# Patient Record
Sex: Male | Born: 1950 | Race: White | Hispanic: No | Marital: Married | Smoking: Former smoker
Health system: Southern US, Community
[De-identification: ages and names within clinical notes are randomized; demographics above are authoritative.]

## PROBLEM LIST (undated history)

## (undated) DIAGNOSIS — T7840XA Allergy, unspecified, initial encounter: Secondary | ICD-10-CM

## (undated) DIAGNOSIS — M1990 Unspecified osteoarthritis, unspecified site: Secondary | ICD-10-CM

## (undated) DIAGNOSIS — E119 Type 2 diabetes mellitus without complications: Secondary | ICD-10-CM

## (undated) DIAGNOSIS — E785 Hyperlipidemia, unspecified: Secondary | ICD-10-CM

## (undated) DIAGNOSIS — H269 Unspecified cataract: Secondary | ICD-10-CM

## (undated) DIAGNOSIS — I10 Essential (primary) hypertension: Secondary | ICD-10-CM

## (undated) HISTORY — DX: Unspecified osteoarthritis, unspecified site: M19.90

## (undated) HISTORY — DX: Unspecified cataract: H26.9

## (undated) HISTORY — DX: Allergy, unspecified, initial encounter: T78.40XA

---

## 2004-04-29 ENCOUNTER — Ambulatory Visit (HOSPITAL_COMMUNITY): Admission: RE | Admit: 2004-04-29 | Discharge: 2004-04-29 | Payer: Self-pay | Admitting: *Deleted

## 2004-08-17 ENCOUNTER — Emergency Department (HOSPITAL_COMMUNITY): Admission: EM | Admit: 2004-08-17 | Discharge: 2004-08-17 | Payer: Self-pay | Admitting: Emergency Medicine

## 2004-10-25 ENCOUNTER — Emergency Department (HOSPITAL_COMMUNITY): Admission: EM | Admit: 2004-10-25 | Discharge: 2004-10-25 | Payer: Self-pay | Admitting: Emergency Medicine

## 2005-03-30 ENCOUNTER — Encounter: Admission: RE | Admit: 2005-03-30 | Discharge: 2005-03-30 | Payer: Self-pay | Admitting: Neurosurgery

## 2005-04-13 ENCOUNTER — Encounter: Admission: RE | Admit: 2005-04-13 | Discharge: 2005-04-13 | Payer: Self-pay | Admitting: Neurosurgery

## 2005-05-21 ENCOUNTER — Encounter: Admission: RE | Admit: 2005-05-21 | Discharge: 2005-05-21 | Payer: Self-pay | Admitting: Neurosurgery

## 2005-08-09 ENCOUNTER — Ambulatory Visit (HOSPITAL_COMMUNITY): Admission: RE | Admit: 2005-08-09 | Discharge: 2005-08-09 | Payer: Self-pay | Admitting: *Deleted

## 2012-10-26 ENCOUNTER — Ambulatory Visit: Payer: BC Managed Care – PPO

## 2012-10-26 ENCOUNTER — Ambulatory Visit (INDEPENDENT_AMBULATORY_CARE_PROVIDER_SITE_OTHER): Payer: BC Managed Care – PPO | Admitting: Internal Medicine

## 2012-10-26 VITALS — BP 133/77 | HR 95 | Temp 98.0°F | Resp 16 | Ht 67.0 in | Wt 239.0 lb

## 2012-10-26 DIAGNOSIS — S8002XA Contusion of left knee, initial encounter: Secondary | ICD-10-CM

## 2012-10-26 DIAGNOSIS — M25562 Pain in left knee: Secondary | ICD-10-CM

## 2012-10-26 DIAGNOSIS — S8000XA Contusion of unspecified knee, initial encounter: Secondary | ICD-10-CM

## 2012-10-26 DIAGNOSIS — M25569 Pain in unspecified knee: Secondary | ICD-10-CM

## 2012-10-26 DIAGNOSIS — M25462 Effusion, left knee: Secondary | ICD-10-CM

## 2012-10-26 DIAGNOSIS — M25469 Effusion, unspecified knee: Secondary | ICD-10-CM

## 2012-10-26 NOTE — Progress Notes (Signed)
  Subjective:    Patient ID: Joel Peters, male    DOB: 1951/02/16, 62 y.o.   MRN: [id number]  HPI Pt presents to clinic today with Lt knee swelling and pain. He states he fell on concrete about 3 weeks ago. The knee initially bruised and swelled a little but since then it has gotten worse. Pt's PCP is Amaryllis Dyke and he has set him up to see an ortho at the end of this month. Pt states he is in a lot of pain and would like the fluid drained off his Lt knee. Swelling is anterior to patella, no redness or heat.  Review of Systems htn    Objective:   Physical Exam  Constitutional: He is oriented to person, place, and time. He appears well-developed and well-nourished.  Pulmonary/Chest: Effort normal.  Musculoskeletal: Normal range of motion. He exhibits edema and tenderness.  Neurological: He is alert and oriented to person, place, and time. He exhibits normal muscle tone. Coordination normal.  Skin: No rash noted. No erythema.  Psychiatric: He has a normal mood and affect.     [facility] reading (PRIMARY) by  Dr Perrin Maltese no fx seen, pre patella swelling Aspirate knee 45cc blood removed/Sterile technique      Assessment & Plan:  Contusion knee/Pre patellar swelling. Wear knee pads/alieve prn/see ortho

## 2012-10-26 NOTE — Patient Instructions (Addendum)
Patella Problems (Patellofemoral Syndrome) This syndrome is caused by changes in the undersurface of the kneecap (patella). The changes vary from minor inflammation to major changes such as breakdown of the cartilage on the undersurface of the patella. The major changes can be seen with an arthroscope (a small, pencil-sized telescope). These changes can result from various factors. These factors may arise from abnormal tracking (movement or malalignment) of the patella. Normally the Patella is in its normal groove located between the condyles (grooved end) of the femur (thigh bone). Abnormal movement leads to increased pressure in the patellofemoral joint. This leads to swelling in the cartilage, inflammation and pain. SYMPTOMS  The patient with this syndrome usually has an ache in the knee. It is often aggravated by:  Prolonged sitting.  Squatting.  Climbing stairs.  Running down hill.  Other exercising that stresses the knee. Other findings may include the knee giving way, swelling, and or locking. TREATMENT  The treatment will depend on the cause of the problem. Sometimes the solution is as simple as cutting down on activities. Giving your joint a rest with the use of crutches and braces can also help. This is generally followed by strengthening exercises. RECOVERY Recovery from a patellar problem depends on the type of problem in your knee and on the treatment required. If conservative treatment works the recovery period may be as little as three to four weeks. If more aggressive therapy such as surgery is required, the recovery period may be several months. Your caregiver will discuss this with you. HOME CARE INSTRUCTIONS  Following exercise, use an ice pack for twenty to thirty minutes three to four times per day. Use a towel between your ice pack and the skin.  Reduction of inflammation with anti-inflammatories may be helpful. Only take over-the-counter or prescription medicines for  pain, discomfort, or fever as directed by your caregiver.  Taping the knee or using a neoprene sleeve with a patellar cutout to provide better tracking of the patella may give relief.  Muscle (quadriceps) strengthening exercises are helpful. Follow your caregiver's advice.  Muscle stretching prior to exercise may be helpful.  Soft tissue therapy using ultrasound, and diathermy may be helpful.  If conservative therapy is not effective, surgery may provide relief. During arthroscopy, your caregiver may discover a rough surface beneath your kneecap. If this happens, your caregiver may smooth this out by shaving the surface. SEEK MEDICAL CARE IF: If you have surgery, see your caregiver if:  There is increased bleeding or clear fluid (more than a small spot) from the wound.  You notice redness, swelling, or increasing pain in the wound.  Pus is coming from wound.  You develop an unexplained oral temperature above 102 F (38.9 C) develops, or as your caregiver suggests.  You notice a foul smell coming from the wound or dressing.  You develop increasing pain or stiffness in your knee. SEEK IMMEDIATE MEDICAL CARE IF:   You develop a rash.  You have difficulty breathing.  You have any allergic problems. MAKE SURE YOU:   Understand these instructions.  Will watch your condition.  Will get help right away if you are not doing well or get worse. Document Released: 04/09/2000 Document Revised: 07/05/2011 Document Reviewed: 04/29/2008 [facility] Patient Information 2014 [place], [place]. Prepatellar Bursitis with Rehab  Bursitis is a condition that is characterized by inflammation of a bursa. Kateri Mc exists in many areas of the body. They are fluid filled sacs that lie between a soft tissue (skin, tendon, or  ligament) and a bone, and they reduce friction between the structures as well as the stress placed on the soft tissue. Prepatellar bursitis is inflammation of the bursa that lies between  the skin and the kneecap (patella). This condition often causes pain over the patella. SYMPTOMS   Pain, tenderness, and/or inflammation over the patella.  Pain that worsens with movement of the knee joint.  Decreased range of motion for the knee joint.  A crackling sound (crepitation) when the bursa is moved or touched.  Occasionally, painless swelling of the bursa.  Fever (when infected). CAUSES  the front of the knee. Repetitive and/ or stressful use of the knee. RISK INCREASES WITH:  Activities in which kneeling and/or falling on one's knees is likely (volleyball or football).  Repetitive and stressful training, especially if it involves running on hills.  Improper training techniques, such as a sudden increase in the intensity, frequency or duration of training.  Failure to warm-up properly before activity.  Poor technique.  Artificial turf. PREVENTION   Avoid kneeling or falling on your knees.  Warm up and stretch properly before activity.  Allow for adequate recovery between workouts.  Maintain physical fitness:  Strength, flexibility, and endurance.  Cardiovascular fitness.  Learn and use proper technique. When possible, a have coach correct improper technique.  Wear properly fitted and padded protective equipment (knee pads). PROGNOSIS  If treated properly, then the symptoms of prepatellar bursitis usually resolve within 2 weeks. RELATED COMPLICATIONS   Recurrent symptoms that result in a chronic problem.  Prolonged healing time, if improperly treated or re-injured.  Limited range of motion.  Infection of bursa.  Chronic inflammation or scarring of bursa. TREATMENT  Treatment initially involves the use of ice and medication to help reduce pain and inflammation. The use of strengthening and stretching exercises may help reduce pain with activity, especially those of the quadriceps and hamstring muscles. These exercises may be performed at home or  with referral to a therapist. Your caregiver may recommend kneepads when you return to playing sports, in order to reduce the stress on the prepatellar bursa. If symptoms persist despite treatment, then your caregiver may drain fluid out with a needle (aspirate) the bursa. If symptoms persist for greater than 6 months despite non-surgical (conservative) treatment, then surgery may be recommended to remove the bursa.  MEDICATION  If pain medication is necessary, then nonsteroidal anti-inflammatory medications, such as aspirin and ibuprofen, or other minor pain relievers, such as acetaminophen, are often recommended.  Do not take pain medication for 7 days before surgery.  Prescription pain relievers may be given if deemed necessary by your caregiver. Use only as directed and only as much as you need.  Corticosteroid injections may be given by your caregiver. These injections should be reserved for the most serious cases, because they may only be given a certain number of times. HEAT AND COLD  Cold treatment (icing) relieves pain and reduces inflammation. Cold treatment should be applied for 10 to 15 minutes every 2 to 3 hours for inflammation and pain and immediately after any activity that aggravates your symptoms. Use ice packs or massage the area with a piece of ice (ice massage).  Heat treatment may be used prior to performing the stretching and strengthening activities prescribed by your caregiver, physical therapist, or athletic trainer. Use a heat pack or soak the injury in warm water. SEEK MEDICAL CARE IF:  Treatment seems to offer no benefit, or the condition worsens.  Any medications produce adverse  side effects. EXERCISES RANGE OF MOTION (ROM) AND STRETCHING EXERCISES - Prepatellar Bursitis These exercises may help you when beginning to rehabilitate your injury. Your symptoms may resolve with or without further involvement from your physician, physical therapist or athletic trainer.  While completing these exercises, remember:   Restoring tissue flexibility helps normal motion to return to the joints. This allows healthier, less painful movement and activity.  An effective stretch should be held for at least 30 seconds.  A stretch should never be painful. You should only feel a gentle lengthening or release in the stretched tissue. STRETCH - Hamstrings, Standing  Stand or sit and extend your right / left leg, placing your foot on a chair or foot stool  Keeping a slight arch in your low back and your hips straight forward.  Lead with your chest and lean forward at the waist until you feel a gentle stretch in the back of your right / left knee or thigh. (When done correctly, this exercise requires leaning only a small distance.)  Hold this position for __________ seconds. Repeat __________ times. Complete this stretch __________ times per day. STRETCH - Quadriceps, Prone   Lie on your stomach on a firm surface, such as a bed or padded floor.  Bend your right / left knee and grasp your ankle. If you are unable to reach, your ankle or pant leg, use a belt around your foot to lengthen your reach.  Gently pull your heel toward your buttocks. Your knee should not slide out to the side. You should feel a stretch in the front of your thigh and/or knee.  Hold this position for __________ seconds. Repeat __________ times. Complete this stretch __________ times per day.  STRETCH - Hamstrings/Adductors, V-Sit   Sit on the floor with your legs extended in a large "V," keeping your knees straight.  With your head and chest upright, bend at your waist reaching for your right foot to stretch your left adductors.  You should feel a stretch in your left inner thigh. Hold for __________ seconds.  Return to the upright position to relax your leg muscles.  Continuing to keep your chest upright, bend straight forward at your waist to stretch your hamstrings.  You should feel a  stretch behind both of your thighs and/or knees. Hold for __________ seconds.  Return to the upright position to relax your leg muscles.  Repeat steps 2 through 4. Repeat __________ times. Complete this exercise __________ times per day.  STRENGTHENING EXERCISES - Prepatellar Bursitis  These exercises may help you when beginning to rehabilitate your injury. They may resolve your symptoms with or without further involvement from your physician, physical therapist or athletic trainer. While completing these exercises, remember:  Muscles can gain both the endurance and the strength needed for everyday activities through controlled exercises.  Complete these exercises as instructed by your physician, physical therapist or athletic trainer. Progress the resistance and repetitions only as guided. STRENGTH - Quadriceps, Isometrics  Lie on your back with your right / left leg extended and your opposite knee bent.  Gradually tense the muscles in the front of your right / left thigh. You should see either your kneecap slide up toward your hip or increased dimpling just above the knee. This motion will push the back of the knee down toward the floor/mat/bed on which you are lying.  Hold the muscle as tight as you can without increasing your pain for __________ seconds.  Relax the muscles slowly and completely  in between each repetition. Repeat __________ times. Complete this exercise __________ times per day.  STRENGTH - Quadriceps, Short Arcs   Lie on your back. Place a __________ inch towel roll under your knee so that the knee slightly bends.  Raise only your lower leg by tightening the muscles in the front of your thigh. Do not allow your thigh to rise.  Hold this position for __________ seconds. Repeat __________ times. Complete this exercise __________ times per day.  OPTIONAL ANKLE WEIGHTS: Begin with ____________________, but DO NOT exceed ____________________. Increase in1 lb/0.5 kg  increments.  STRENGTH - Quadriceps, Straight Leg Raises  Quality counts! Watch for signs that the quadriceps muscle is working to insure you are strengthening the correct muscles and not "cheating" by substituting with healthier muscles.  Lay on your back with your right / left leg extended and your opposite knee bent.  Tense the muscles in the front of your right / left thigh. You should see either your kneecap slide up or increased dimpling just above the knee. Your thigh may even quiver.  Tighten these muscles even more and raise your leg 4 to 6 inches off the floor. Hold for __________ seconds.  Keeping these muscles tense, lower your leg.  Relax the muscles slowly and completely in between each repetition. Repeat __________ times. Complete this exercise __________ times per day.  STRENGTH - Quadriceps, Step-Ups   Use a thick book, step or step stool that is __________ inches tall.  Holding a wall or counter for balance only, not support.  Slowly step-up with your right / left foot, keeping your knee in line with your hip and foot. Do not allow your knee to bend so far that you cannot see your toes.  Slowly unlock your knee and lower yourself to the starting position. Your muscles, not gravity, should lower you. Repeat __________ times. Complete this exercise __________ times per day. Document Released: 04/12/2005 Document Revised: 07/05/2011 Document Reviewed: 07/25/2008 [facility] Patient Information 2014 [place], [place].

## 2012-11-22 ENCOUNTER — Other Ambulatory Visit: Payer: Self-pay | Admitting: Internal Medicine

## 2012-11-22 DIAGNOSIS — S4992XA Unspecified injury of left shoulder and upper arm, initial encounter: Secondary | ICD-10-CM

## 2012-11-28 ENCOUNTER — Ambulatory Visit
Admission: RE | Admit: 2012-11-28 | Discharge: 2012-11-28 | Disposition: A | Discharge status: Home / Self Care | Payer: BC Managed Care – PPO | Source: Ambulatory Visit | Attending: Internal Medicine | Admitting: Internal Medicine

## 2012-11-28 DIAGNOSIS — S4992XA Unspecified injury of left shoulder and upper arm, initial encounter: Secondary | ICD-10-CM

## 2016-05-05 ENCOUNTER — Emergency Department (INDEPENDENT_AMBULATORY_CARE_PROVIDER_SITE_OTHER): Payer: BLUE CROSS/BLUE SHIELD

## 2016-05-05 ENCOUNTER — Emergency Department (INDEPENDENT_AMBULATORY_CARE_PROVIDER_SITE_OTHER)
Admission: EM | Admit: 2016-05-05 | Discharge: 2016-05-05 | Disposition: A | Discharge status: Home / Self Care | Payer: BLUE CROSS/BLUE SHIELD | Source: Home / Self Care | Attending: Family Medicine | Admitting: Family Medicine

## 2016-05-05 ENCOUNTER — Encounter: Payer: Self-pay | Admitting: *Deleted

## 2016-05-05 DIAGNOSIS — Z23 Encounter for immunization: Secondary | ICD-10-CM | POA: Diagnosis not present

## 2016-05-05 DIAGNOSIS — L03011 Cellulitis of right finger: Secondary | ICD-10-CM

## 2016-05-05 DIAGNOSIS — M19041 Primary osteoarthritis, right hand: Secondary | ICD-10-CM | POA: Diagnosis not present

## 2016-05-05 HISTORY — DX: Essential (primary) hypertension: I10

## 2016-05-05 HISTORY — DX: Hyperlipidemia, unspecified: E78.5

## 2016-05-05 HISTORY — DX: Type 2 diabetes mellitus without complications: E11.9

## 2016-05-05 MED ORDER — CLINDAMYCIN HCL 300 MG PO CAPS: 300.0000 mg | ORAL_CAPSULE | Freq: Three times a day (TID) | ORAL | 0 refills | Status: AC

## 2016-05-05 MED ORDER — TETANUS-DIPHTH-ACELL PERTUSSIS 5-2.5-18.5 LF-MCG/0.5 IM SUSP
0.5000 mL | Freq: Once | INTRAMUSCULAR | Status: AC
  Administered 2016-05-05: 0.5 mL via INTRAMUSCULAR

## 2016-05-05 NOTE — ED Triage Notes (Signed)
Pt c/o RT 2nd finger redness, swelling and possible splinter of wood x 8 days.

## 2016-05-05 NOTE — ED Provider Notes (Signed)
[facility]    CSN: [id number] Arrival date & time: 05/05/16  1809     History   Chief Complaint Chief Complaint  Patient presents with  . Foreign Body in Skin    HPI Joel Peters is a 66 y.o. male.   Patient removed a wood splinter from the dorsal surface of his right second finger DIP joint about 8 days ago.  He had continued swelling of his finger, and believed that there was a retained piece of splinter present.  He drained the inflamed area yesterday without finding additional foreign body. He does not remember his last Tdap.   The history is provided by the patient and the spouse.  Hand Pain  This is a new problem. The current episode started more than 1 week ago. The problem occurs constantly. The problem has been gradually worsening. Associated symptoms comments: No fever. Exacerbated by: flexing finger. Nothing relieves the symptoms. Treatments tried: draining wound. The treatment provided no relief.    Past Medical History:  Diagnosis Date  . Allergy   . Arthritis   . Cataract   . Diabetes mellitus without complication (HCC)   . Hyperlipidemia   . Hypertension     There are no active problems to display for this patient.   History reviewed. No pertinent surgical history.     Home Medications    Prior to Admission medications   Medication Sig Start Date End Date Taking? Authorizing Provider  celecoxib (CELEBREX) 200 MG capsule Take 200 mg by mouth daily.   Yes Historical Provider, MD  pantoprazole (PROTONIX) 40 MG tablet Take 40 mg by mouth daily.   Yes Historical Provider, MD  simvastatin (ZOCOR) 40 MG tablet Take 40 mg by mouth every evening.   Yes Historical Provider, MD  telmisartan-hydrochlorothiazide (MICARDIS HCT) 80-12.5 MG per tablet Take 1 tablet by mouth daily.   Yes Historical Provider, MD  clindamycin (CLEOCIN) 300 MG capsule Take 1 capsule (300 mg total) by mouth 3 (three) times daily. (every 8 hours) 05/05/16   Lattie HawStephen A Kloee Ballew,  MD  fexofenadine (ALLEGRA) 180 MG tablet Take 180 mg by mouth daily.    Historical Provider, MD    Family History Family History  Problem Relation Age of Onset  . Cancer Mother     leukemia  . Hypertension Father   . Hyperlipidemia Father   . Diabetes Father   . Stroke Father     Social History Social History  Substance Use Topics  . Smoking status: Former Games developermoker  . Smokeless tobacco: Never Used  . Alcohol use No     Allergies   Codeine   Review of Systems Review of Systems  Constitutional: Negative for appetite change, chills, diaphoresis, fatigue and fever.  All other systems reviewed and are negative.    Physical Exam Triage Vital Signs ED Triage Vitals  Enc Vitals Group     BP 05/05/16 1832 152/82     Pulse Rate 05/05/16 1832 87     Resp 05/05/16 1832 18     Temp 05/05/16 1832 98.3 F (36.8 C)     Temp Source 05/05/16 1832 Oral     SpO2 05/05/16 1832 94 %     Weight 05/05/16 1835 237 lb (107.5 kg)     Height --      Head Circumference --      Peak Flow --      Pain Score 05/05/16 1836 8     Pain Loc --  Pain Edu? --      Excl. in GC? --    No data found.   Updated Vital Signs BP 152/82 (BP Location: Left Arm)   Pulse 87   Temp 98.3 F (36.8 C) (Oral)   Resp 18   Wt 237 lb (107.5 kg)   SpO2 94%   BMI 37.12 kg/m   Visual Acuity Right Eye Distance:   Left Eye Distance:   Bilateral Distance:    Right Eye Near:   Left Eye Near:    Bilateral Near:     Physical Exam  Constitutional: He appears well-developed and well-nourished. No distress.  HENT:  Head: Normocephalic.  Nose: Nose normal.  Eyes: Pupils are equal, round, and reactive to light.  Cardiovascular: Normal rate.   Pulmonary/Chest: Effort normal.  Musculoskeletal:       Right hand: He exhibits decreased range of motion, tenderness and swelling. Normal sensation noted.       Hands: The dorsal surface of the right second finger DIP joint is erythematous and swollen, with  evidence of recent drainage.  Indurated but not fluctuant.  Distal neurovascular function is intact.  No evidence of retained foreign body.  Neurological: He is alert.  Skin: Skin is warm and dry.  Nursing note and vitals reviewed.    UC Treatments / Results  Labs (all labs ordered are listed, but only abnormal results are displayed) Labs Reviewed - No data to display  EKG  EKG Interpretation None       Radiology Dg Finger Index Right  Result Date: 05/05/2016 CLINICAL DATA:  A piece of wood lodged in the patient's finger 8 days ago while removing a window. Pain and swelling. Initial encounter. EXAM: RIGHT INDEX FINGER 2+V COMPARISON:  None. FINDINGS: Soft tissues are diffusely swollen. No radiopaque foreign body is identified. No bony destructive change or soft tissue gas. 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 IMPRESSION: Soft tissue swelling. Joel Peters is not radiopaque. No foreign body is seen. Osteoarthritis PIP and DIP joints. Electronically Signed   By: Drusilla Kanner M.D.   On: 05/05/2016 18:56    Procedures Procedures (including critical care time)  Medications Ordered in UC Medications - No data to display   Initial Impression / Assessment and Plan / UC Course  I have reviewed the triage vital signs and the nursing notes.  Pertinent labs & imaging results that were available during my care of the patient were reviewed by me and considered in my medical decision making (see chart for details).  Clinical Course   No evidence retained foreign body by exam. Administered Tdap. Begin Clindamycin 300mg  TID for staph coverage. Continue warm soak two or three times daily.  Apply bandage daily to protect joint. Followup with hand surgeon if not resolved one week.     Final Clinical Impressions(s) / UC Diagnoses   Final diagnoses:  Cellulitis of right index finger    New Prescriptions New Prescriptions   CLINDAMYCIN (CLEOCIN) 300 MG  CAPSULE    Take 1 capsule (300 mg total) by mouth 3 (three) times daily. (every 8 hours)     Lattie Haw, MD 05/18/16 1753

## 2016-05-05 NOTE — Discharge Instructions (Signed)
Continue warm soak two or three times daily.  Apply bandage daily to protect joint.

## 2017-02-07 DIAGNOSIS — L439 Lichen planus, unspecified: Secondary | ICD-10-CM | POA: Diagnosis not present

## 2017-02-07 DIAGNOSIS — Z79899 Other long term (current) drug therapy: Secondary | ICD-10-CM | POA: Diagnosis not present

## 2017-02-07 DIAGNOSIS — B370 Candidal stomatitis: Secondary | ICD-10-CM | POA: Diagnosis not present

## 2017-02-07 DIAGNOSIS — L438 Other lichen planus: Secondary | ICD-10-CM | POA: Diagnosis not present

## 2017-03-01 DIAGNOSIS — E7800 Pure hypercholesterolemia, unspecified: Secondary | ICD-10-CM | POA: Diagnosis not present

## 2017-03-01 DIAGNOSIS — I10 Essential (primary) hypertension: Secondary | ICD-10-CM | POA: Diagnosis not present

## 2017-03-01 DIAGNOSIS — M545 Low back pain: Secondary | ICD-10-CM | POA: Diagnosis not present

## 2017-03-01 DIAGNOSIS — G8929 Other chronic pain: Secondary | ICD-10-CM | POA: Diagnosis not present

## 2017-04-07 DIAGNOSIS — Z0000 Encounter for general adult medical examination without abnormal findings: Secondary | ICD-10-CM | POA: Diagnosis not present

## 2017-04-07 DIAGNOSIS — E291 Testicular hypofunction: Secondary | ICD-10-CM | POA: Diagnosis not present

## 2017-04-07 DIAGNOSIS — Z125 Encounter for screening for malignant neoplasm of prostate: Secondary | ICD-10-CM | POA: Diagnosis not present

## 2017-04-07 DIAGNOSIS — I10 Essential (primary) hypertension: Secondary | ICD-10-CM | POA: Diagnosis not present

## 2017-04-13 DIAGNOSIS — E291 Testicular hypofunction: Secondary | ICD-10-CM | POA: Diagnosis not present

## 2017-04-25 DIAGNOSIS — E7800 Pure hypercholesterolemia, unspecified: Secondary | ICD-10-CM | POA: Diagnosis not present

## 2017-04-25 DIAGNOSIS — Z0001 Encounter for general adult medical examination with abnormal findings: Secondary | ICD-10-CM | POA: Diagnosis not present

## 2017-04-25 DIAGNOSIS — E291 Testicular hypofunction: Secondary | ICD-10-CM | POA: Diagnosis not present

## 2017-04-25 DIAGNOSIS — I10 Essential (primary) hypertension: Secondary | ICD-10-CM | POA: Diagnosis not present

## 2017-05-04 DIAGNOSIS — E291 Testicular hypofunction: Secondary | ICD-10-CM | POA: Diagnosis not present

## 2017-05-25 DIAGNOSIS — E291 Testicular hypofunction: Secondary | ICD-10-CM | POA: Diagnosis not present

## 2017-06-15 DIAGNOSIS — E291 Testicular hypofunction: Secondary | ICD-10-CM | POA: Diagnosis not present

## 2017-06-24 DIAGNOSIS — H2513 Age-related nuclear cataract, bilateral: Secondary | ICD-10-CM | POA: Diagnosis not present

## 2017-06-24 DIAGNOSIS — H524 Presbyopia: Secondary | ICD-10-CM | POA: Diagnosis not present

## 2017-06-24 DIAGNOSIS — H11153 Pinguecula, bilateral: Secondary | ICD-10-CM | POA: Diagnosis not present

## 2017-07-13 DIAGNOSIS — E291 Testicular hypofunction: Secondary | ICD-10-CM | POA: Diagnosis not present

## 2017-07-22 DIAGNOSIS — E291 Testicular hypofunction: Secondary | ICD-10-CM | POA: Diagnosis not present

## 2017-07-22 DIAGNOSIS — Z0000 Encounter for general adult medical examination without abnormal findings: Secondary | ICD-10-CM | POA: Diagnosis not present

## 2017-07-22 DIAGNOSIS — E7800 Pure hypercholesterolemia, unspecified: Secondary | ICD-10-CM | POA: Diagnosis not present

## 2017-07-22 DIAGNOSIS — I10 Essential (primary) hypertension: Secondary | ICD-10-CM | POA: Diagnosis not present

## 2017-07-29 DIAGNOSIS — Z79899 Other long term (current) drug therapy: Secondary | ICD-10-CM | POA: Diagnosis not present

## 2017-07-29 DIAGNOSIS — I10 Essential (primary) hypertension: Secondary | ICD-10-CM | POA: Diagnosis not present

## 2017-07-29 DIAGNOSIS — E559 Vitamin D deficiency, unspecified: Secondary | ICD-10-CM | POA: Diagnosis not present

## 2017-07-29 DIAGNOSIS — R739 Hyperglycemia, unspecified: Secondary | ICD-10-CM | POA: Diagnosis not present

## 2017-07-29 DIAGNOSIS — E291 Testicular hypofunction: Secondary | ICD-10-CM | POA: Diagnosis not present

## 2017-07-29 DIAGNOSIS — E7800 Pure hypercholesterolemia, unspecified: Secondary | ICD-10-CM | POA: Diagnosis not present

## 2017-07-29 DIAGNOSIS — M069 Rheumatoid arthritis, unspecified: Secondary | ICD-10-CM | POA: Diagnosis not present

## 2017-08-08 DIAGNOSIS — L438 Other lichen planus: Secondary | ICD-10-CM | POA: Diagnosis not present

## 2017-08-08 DIAGNOSIS — B370 Candidal stomatitis: Secondary | ICD-10-CM | POA: Diagnosis not present

## 2017-08-08 DIAGNOSIS — Z79899 Other long term (current) drug therapy: Secondary | ICD-10-CM | POA: Diagnosis not present

## 2017-10-09 IMAGING — DX DG FINGER INDEX 2+V*R*
3 series · 3 of 3 positions shown · non-contrast
Comparison: None.

CLINICAL DATA: A piece of Rodman lodged in the patient's finger 8
days ago while removing a window. Pain and swelling. Initial
encounter.

EXAM:
RIGHT INDEX FINGER 2+V

[finger ap]
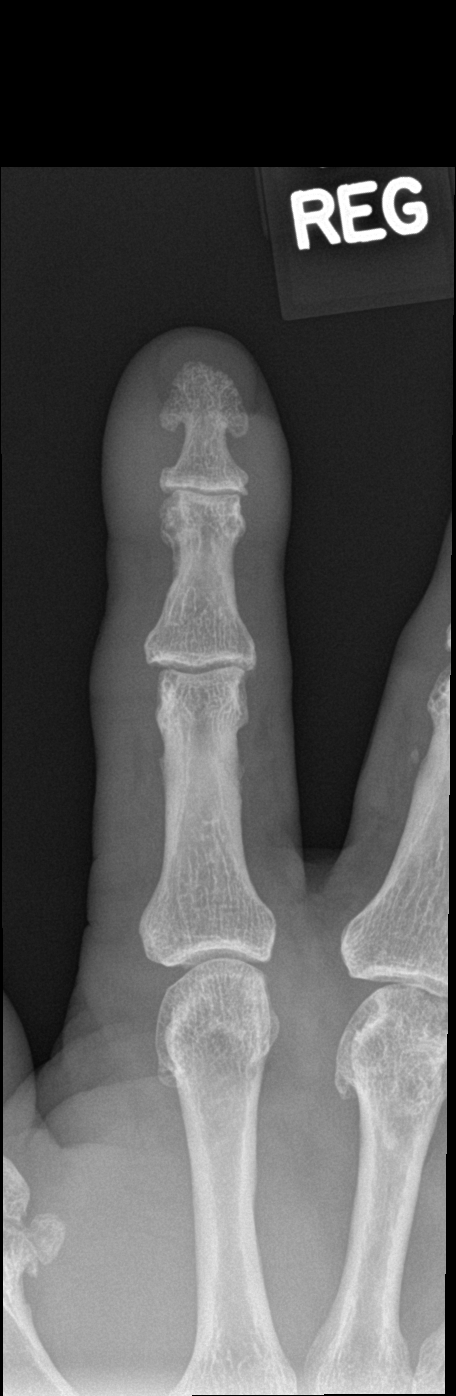

[finger obl]
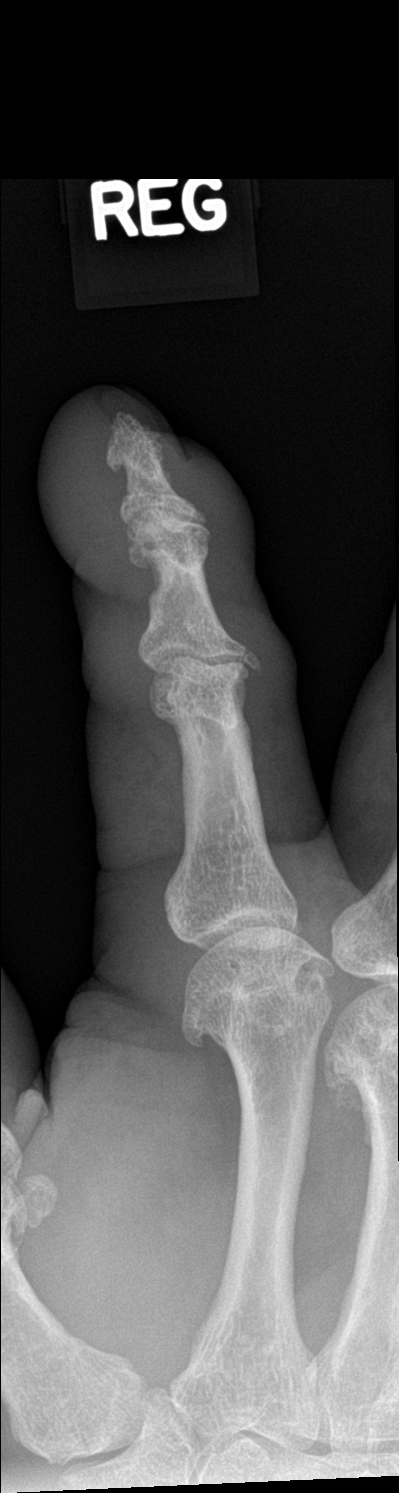

[finger lat]
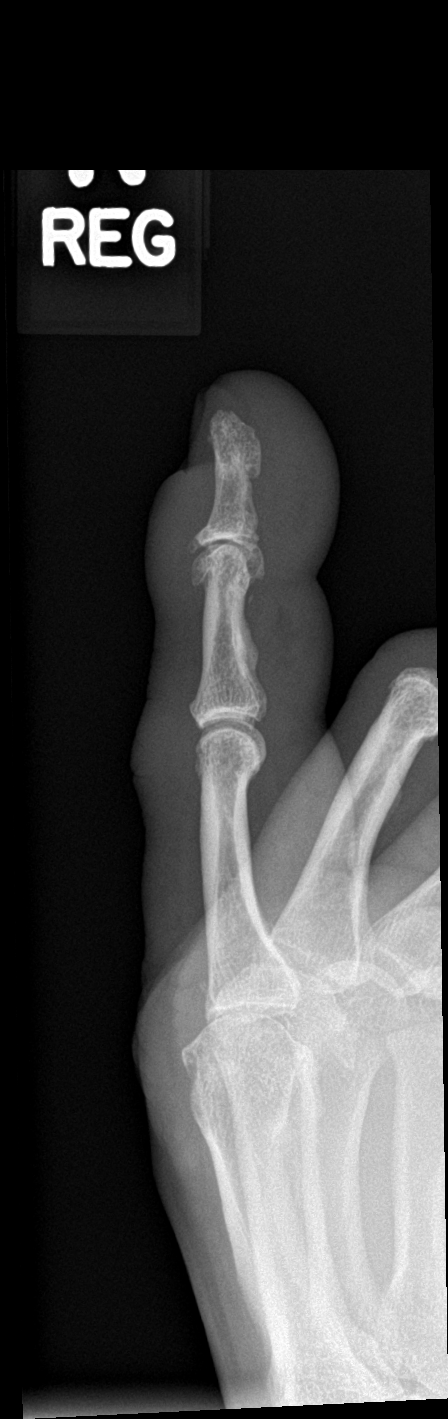

[3 of 3 positions shown; findings below may reference images not displayed]

FINDINGS: Soft tissues are diffusely swollen. No radiopaque foreign body is
identified. No bony destructive change or soft tissue gas.
Osteoarthritis about the DIP and PIP joints is worse at the DIP
joint.
IMPRESSION: Soft tissue swelling. Sharma is not radiopaque. No foreign body is
seen.

Osteoarthritis PIP and DIP joints.

## 2017-10-11 DIAGNOSIS — E291 Testicular hypofunction: Secondary | ICD-10-CM | POA: Diagnosis not present

## 2017-10-31 DIAGNOSIS — I10 Essential (primary) hypertension: Secondary | ICD-10-CM | POA: Diagnosis not present

## 2017-10-31 DIAGNOSIS — R739 Hyperglycemia, unspecified: Secondary | ICD-10-CM | POA: Diagnosis not present

## 2017-10-31 DIAGNOSIS — D649 Anemia, unspecified: Secondary | ICD-10-CM | POA: Diagnosis not present

## 2017-10-31 DIAGNOSIS — R718 Other abnormality of red blood cells: Secondary | ICD-10-CM | POA: Diagnosis not present

## 2017-10-31 DIAGNOSIS — E7800 Pure hypercholesterolemia, unspecified: Secondary | ICD-10-CM | POA: Diagnosis not present

## 2017-11-03 DIAGNOSIS — I10 Essential (primary) hypertension: Secondary | ICD-10-CM | POA: Diagnosis not present

## 2017-11-03 DIAGNOSIS — E291 Testicular hypofunction: Secondary | ICD-10-CM | POA: Diagnosis not present

## 2017-11-22 DIAGNOSIS — H9311 Tinnitus, right ear: Secondary | ICD-10-CM | POA: Diagnosis not present

## 2017-11-22 DIAGNOSIS — H903 Sensorineural hearing loss, bilateral: Secondary | ICD-10-CM | POA: Diagnosis not present

## 2017-11-22 DIAGNOSIS — H8143 Vertigo of central origin, bilateral: Secondary | ICD-10-CM | POA: Diagnosis not present

## 2017-11-22 DIAGNOSIS — J342 Deviated nasal septum: Secondary | ICD-10-CM | POA: Diagnosis not present

## 2017-11-23 DIAGNOSIS — M19041 Primary osteoarthritis, right hand: Secondary | ICD-10-CM | POA: Diagnosis not present

## 2017-11-23 DIAGNOSIS — M12812 Other specific arthropathies, not elsewhere classified, left shoulder: Secondary | ICD-10-CM | POA: Diagnosis not present

## 2017-11-23 DIAGNOSIS — M150 Primary generalized (osteo)arthritis: Secondary | ICD-10-CM | POA: Diagnosis not present

## 2017-11-23 DIAGNOSIS — M19042 Primary osteoarthritis, left hand: Secondary | ICD-10-CM | POA: Diagnosis not present

## 2017-12-15 DIAGNOSIS — R739 Hyperglycemia, unspecified: Secondary | ICD-10-CM | POA: Diagnosis not present

## 2017-12-15 DIAGNOSIS — E7800 Pure hypercholesterolemia, unspecified: Secondary | ICD-10-CM | POA: Diagnosis not present

## 2017-12-15 DIAGNOSIS — E291 Testicular hypofunction: Secondary | ICD-10-CM | POA: Diagnosis not present

## 2017-12-15 DIAGNOSIS — I10 Essential (primary) hypertension: Secondary | ICD-10-CM | POA: Diagnosis not present

## 2018-01-05 DIAGNOSIS — E291 Testicular hypofunction: Secondary | ICD-10-CM | POA: Diagnosis not present

## 2018-01-26 DIAGNOSIS — E291 Testicular hypofunction: Secondary | ICD-10-CM | POA: Diagnosis not present

## 2018-02-16 DIAGNOSIS — E291 Testicular hypofunction: Secondary | ICD-10-CM | POA: Diagnosis not present

## 2018-02-16 DIAGNOSIS — Z23 Encounter for immunization: Secondary | ICD-10-CM | POA: Diagnosis not present

## 2018-03-09 DIAGNOSIS — E291 Testicular hypofunction: Secondary | ICD-10-CM | POA: Diagnosis not present

## 2018-03-30 DIAGNOSIS — E291 Testicular hypofunction: Secondary | ICD-10-CM | POA: Diagnosis not present

## 2018-04-25 DIAGNOSIS — E291 Testicular hypofunction: Secondary | ICD-10-CM | POA: Diagnosis not present

## 2018-05-17 DIAGNOSIS — E291 Testicular hypofunction: Secondary | ICD-10-CM | POA: Diagnosis not present

## 2018-06-07 DIAGNOSIS — E291 Testicular hypofunction: Secondary | ICD-10-CM | POA: Diagnosis not present

## 2018-06-07 DIAGNOSIS — R739 Hyperglycemia, unspecified: Secondary | ICD-10-CM | POA: Diagnosis not present

## 2018-06-07 DIAGNOSIS — E7800 Pure hypercholesterolemia, unspecified: Secondary | ICD-10-CM | POA: Diagnosis not present

## 2018-06-07 DIAGNOSIS — I10 Essential (primary) hypertension: Secondary | ICD-10-CM | POA: Diagnosis not present

## 2018-06-14 DIAGNOSIS — I10 Essential (primary) hypertension: Secondary | ICD-10-CM | POA: Diagnosis not present

## 2018-06-14 DIAGNOSIS — K219 Gastro-esophageal reflux disease without esophagitis: Secondary | ICD-10-CM | POA: Diagnosis not present

## 2018-06-14 DIAGNOSIS — E7800 Pure hypercholesterolemia, unspecified: Secondary | ICD-10-CM | POA: Diagnosis not present

## 2018-06-14 DIAGNOSIS — E291 Testicular hypofunction: Secondary | ICD-10-CM | POA: Diagnosis not present

## 2018-06-20 DIAGNOSIS — M19042 Primary osteoarthritis, left hand: Secondary | ICD-10-CM | POA: Diagnosis not present

## 2018-06-20 DIAGNOSIS — M12812 Other specific arthropathies, not elsewhere classified, left shoulder: Secondary | ICD-10-CM | POA: Diagnosis not present

## 2018-06-20 DIAGNOSIS — M19041 Primary osteoarthritis, right hand: Secondary | ICD-10-CM | POA: Diagnosis not present

## 2018-06-20 DIAGNOSIS — M150 Primary generalized (osteo)arthritis: Secondary | ICD-10-CM | POA: Diagnosis not present

## 2018-06-28 DIAGNOSIS — E291 Testicular hypofunction: Secondary | ICD-10-CM | POA: Diagnosis not present

## 2018-07-19 DIAGNOSIS — E291 Testicular hypofunction: Secondary | ICD-10-CM | POA: Diagnosis not present

## 2018-08-09 DIAGNOSIS — Z23 Encounter for immunization: Secondary | ICD-10-CM | POA: Diagnosis not present

## 2018-08-30 DIAGNOSIS — E291 Testicular hypofunction: Secondary | ICD-10-CM | POA: Diagnosis not present

## 2018-09-25 DIAGNOSIS — E291 Testicular hypofunction: Secondary | ICD-10-CM | POA: Diagnosis not present

## 2018-10-16 DIAGNOSIS — E291 Testicular hypofunction: Secondary | ICD-10-CM | POA: Diagnosis not present

## 2018-11-06 DIAGNOSIS — E291 Testicular hypofunction: Secondary | ICD-10-CM | POA: Diagnosis not present

## 2018-11-14 DIAGNOSIS — M19041 Primary osteoarthritis, right hand: Secondary | ICD-10-CM | POA: Diagnosis not present

## 2018-11-14 DIAGNOSIS — M150 Primary generalized (osteo)arthritis: Secondary | ICD-10-CM | POA: Diagnosis not present

## 2018-11-14 DIAGNOSIS — M19042 Primary osteoarthritis, left hand: Secondary | ICD-10-CM | POA: Diagnosis not present

## 2018-11-14 DIAGNOSIS — M12812 Other specific arthropathies, not elsewhere classified, left shoulder: Secondary | ICD-10-CM | POA: Diagnosis not present

## 2018-12-06 DIAGNOSIS — Z125 Encounter for screening for malignant neoplasm of prostate: Secondary | ICD-10-CM | POA: Diagnosis not present

## 2018-12-06 DIAGNOSIS — E291 Testicular hypofunction: Secondary | ICD-10-CM | POA: Diagnosis not present

## 2018-12-06 DIAGNOSIS — E7800 Pure hypercholesterolemia, unspecified: Secondary | ICD-10-CM | POA: Diagnosis not present

## 2018-12-06 DIAGNOSIS — I10 Essential (primary) hypertension: Secondary | ICD-10-CM | POA: Diagnosis not present

## 2018-12-06 DIAGNOSIS — R7309 Other abnormal glucose: Secondary | ICD-10-CM | POA: Diagnosis not present

## 2018-12-13 DIAGNOSIS — Z0000 Encounter for general adult medical examination without abnormal findings: Secondary | ICD-10-CM | POA: Diagnosis not present

## 2018-12-13 DIAGNOSIS — M069 Rheumatoid arthritis, unspecified: Secondary | ICD-10-CM | POA: Diagnosis not present

## 2018-12-13 DIAGNOSIS — I10 Essential (primary) hypertension: Secondary | ICD-10-CM | POA: Diagnosis not present

## 2018-12-13 DIAGNOSIS — E291 Testicular hypofunction: Secondary | ICD-10-CM | POA: Diagnosis not present

## 2018-12-27 DIAGNOSIS — E291 Testicular hypofunction: Secondary | ICD-10-CM | POA: Diagnosis not present

## 2019-01-17 DIAGNOSIS — Z23 Encounter for immunization: Secondary | ICD-10-CM | POA: Diagnosis not present

## 2019-02-07 DIAGNOSIS — K5730 Diverticulosis of large intestine without perforation or abscess without bleeding: Secondary | ICD-10-CM | POA: Diagnosis not present

## 2019-02-07 DIAGNOSIS — E291 Testicular hypofunction: Secondary | ICD-10-CM | POA: Diagnosis not present

## 2019-02-07 DIAGNOSIS — K640 First degree hemorrhoids: Secondary | ICD-10-CM | POA: Diagnosis not present

## 2019-02-07 DIAGNOSIS — K921 Melena: Secondary | ICD-10-CM | POA: Diagnosis not present

## 2019-02-07 DIAGNOSIS — K625 Hemorrhage of anus and rectum: Secondary | ICD-10-CM | POA: Diagnosis not present

## 2019-02-28 DIAGNOSIS — Z23 Encounter for immunization: Secondary | ICD-10-CM | POA: Diagnosis not present

## 2019-03-21 DIAGNOSIS — E291 Testicular hypofunction: Secondary | ICD-10-CM | POA: Diagnosis not present

## 2019-04-11 DIAGNOSIS — E291 Testicular hypofunction: Secondary | ICD-10-CM | POA: Diagnosis not present

## 2019-05-07 DIAGNOSIS — E291 Testicular hypofunction: Secondary | ICD-10-CM | POA: Diagnosis not present

## 2019-06-07 DIAGNOSIS — E291 Testicular hypofunction: Secondary | ICD-10-CM | POA: Diagnosis not present

## 2019-06-07 DIAGNOSIS — R7309 Other abnormal glucose: Secondary | ICD-10-CM | POA: Diagnosis not present

## 2019-06-07 DIAGNOSIS — I10 Essential (primary) hypertension: Secondary | ICD-10-CM | POA: Diagnosis not present

## 2019-06-17 ENCOUNTER — Ambulatory Visit: Payer: Medicare Other | Attending: Internal Medicine

## 2019-06-17 DIAGNOSIS — Z23 Encounter for immunization: Secondary | ICD-10-CM | POA: Insufficient documentation

## 2019-06-17 NOTE — Progress Notes (Signed)
   Covid-19 Vaccination Clinic  Name:  Joel Peters    MRN: [id number] DOB: 06-Nov-1950  06/17/2019  Mr. Engram was observed post Covid-19 immunization for 15 minutes without incidence. He was provided with Vaccine Information Sheet and instruction to access the V-Safe system.   Mr. Dicaprio was instructed to call 911 with any severe reactions post vaccine: Marland Kitchen Difficulty breathing  . Swelling of your face and throat  . A fast heartbeat  . A bad rash all over your body  . Dizziness and weakness    Immunizations Administered    Name Date Dose VIS Date Route   Pfizer COVID-19 Vaccine 06/17/2019  9:53 AM 0.3 mL 04/06/2019 Intramuscular   Manufacturer: [employer], [employer]   Lot: [id number]   NDC: [address]-3

## 2019-06-21 DIAGNOSIS — I10 Essential (primary) hypertension: Secondary | ICD-10-CM | POA: Diagnosis not present

## 2019-06-21 DIAGNOSIS — R7309 Other abnormal glucose: Secondary | ICD-10-CM | POA: Diagnosis not present

## 2019-06-21 DIAGNOSIS — M069 Rheumatoid arthritis, unspecified: Secondary | ICD-10-CM | POA: Diagnosis not present

## 2019-06-21 DIAGNOSIS — E291 Testicular hypofunction: Secondary | ICD-10-CM | POA: Diagnosis not present

## 2019-06-27 DIAGNOSIS — E291 Testicular hypofunction: Secondary | ICD-10-CM | POA: Diagnosis not present

## 2019-07-10 ENCOUNTER — Ambulatory Visit: Payer: Medicare Other | Attending: Internal Medicine

## 2019-07-10 DIAGNOSIS — Z23 Encounter for immunization: Secondary | ICD-10-CM

## 2019-07-10 NOTE — Progress Notes (Signed)
   Covid-19 Vaccination Clinic  Name:  KEITHON MCCOIN    MRN: [id number] DOB: 09/07/1950  07/10/2019  Mr. Spratlin was observed post Covid-19 immunization for 15 minutes without incident. He was provided with Vaccine Information Sheet and instruction to access the V-Safe system.   Mr. Clayson was instructed to call 911 with any severe reactions post vaccine: Marland Kitchen Difficulty breathing  . Swelling of face and throat  . A fast heartbeat  . A bad rash all over body  . Dizziness and weakness   Immunizations Administered    Name Date Dose VIS Date Route   Pfizer COVID-19 Vaccine 07/10/2019  9:09 AM 0.3 mL 04/06/2019 Intramuscular   Manufacturer: [employer], [employer]   Lot: [id number]   NDC: [address]-6

## 2019-07-24 DIAGNOSIS — Z0120 Encounter for dental examination and cleaning without abnormal findings: Secondary | ICD-10-CM | POA: Diagnosis not present

## 2019-08-06 DIAGNOSIS — M150 Primary generalized (osteo)arthritis: Secondary | ICD-10-CM | POA: Diagnosis not present

## 2019-08-06 DIAGNOSIS — M19041 Primary osteoarthritis, right hand: Secondary | ICD-10-CM | POA: Diagnosis not present

## 2019-08-06 DIAGNOSIS — M19042 Primary osteoarthritis, left hand: Secondary | ICD-10-CM | POA: Diagnosis not present

## 2019-08-06 DIAGNOSIS — M12812 Other specific arthropathies, not elsewhere classified, left shoulder: Secondary | ICD-10-CM | POA: Diagnosis not present

## 2019-08-15 DIAGNOSIS — E291 Testicular hypofunction: Secondary | ICD-10-CM | POA: Diagnosis not present

## 2019-10-03 DIAGNOSIS — E291 Testicular hypofunction: Secondary | ICD-10-CM | POA: Diagnosis not present

## 2019-10-24 DIAGNOSIS — E291 Testicular hypofunction: Secondary | ICD-10-CM | POA: Diagnosis not present

## 2019-11-14 DIAGNOSIS — E291 Testicular hypofunction: Secondary | ICD-10-CM | POA: Diagnosis not present

## 2019-12-12 DIAGNOSIS — E7800 Pure hypercholesterolemia, unspecified: Secondary | ICD-10-CM | POA: Diagnosis not present

## 2019-12-12 DIAGNOSIS — E291 Testicular hypofunction: Secondary | ICD-10-CM | POA: Diagnosis not present

## 2019-12-12 DIAGNOSIS — R7309 Other abnormal glucose: Secondary | ICD-10-CM | POA: Diagnosis not present

## 2019-12-12 DIAGNOSIS — I10 Essential (primary) hypertension: Secondary | ICD-10-CM | POA: Diagnosis not present

## 2019-12-19 DIAGNOSIS — E291 Testicular hypofunction: Secondary | ICD-10-CM | POA: Diagnosis not present

## 2019-12-19 DIAGNOSIS — I10 Essential (primary) hypertension: Secondary | ICD-10-CM | POA: Diagnosis not present

## 2019-12-19 DIAGNOSIS — K219 Gastro-esophageal reflux disease without esophagitis: Secondary | ICD-10-CM | POA: Diagnosis not present

## 2019-12-19 DIAGNOSIS — Z0000 Encounter for general adult medical examination without abnormal findings: Secondary | ICD-10-CM | POA: Diagnosis not present

## 2019-12-25 DIAGNOSIS — H524 Presbyopia: Secondary | ICD-10-CM | POA: Diagnosis not present

## 2020-01-03 DIAGNOSIS — E291 Testicular hypofunction: Secondary | ICD-10-CM | POA: Diagnosis not present

## 2020-01-03 DIAGNOSIS — Z125 Encounter for screening for malignant neoplasm of prostate: Secondary | ICD-10-CM | POA: Diagnosis not present

## 2020-01-21 DIAGNOSIS — H25043 Posterior subcapsular polar age-related cataract, bilateral: Secondary | ICD-10-CM | POA: Diagnosis not present

## 2020-01-21 DIAGNOSIS — H25013 Cortical age-related cataract, bilateral: Secondary | ICD-10-CM | POA: Diagnosis not present

## 2020-01-21 DIAGNOSIS — H2513 Age-related nuclear cataract, bilateral: Secondary | ICD-10-CM | POA: Diagnosis not present

## 2020-01-21 DIAGNOSIS — I10 Essential (primary) hypertension: Secondary | ICD-10-CM | POA: Diagnosis not present

## 2020-01-24 DIAGNOSIS — E291 Testicular hypofunction: Secondary | ICD-10-CM | POA: Diagnosis not present

## 2020-01-24 DIAGNOSIS — Z23 Encounter for immunization: Secondary | ICD-10-CM | POA: Diagnosis not present

## 2020-02-14 DIAGNOSIS — E291 Testicular hypofunction: Secondary | ICD-10-CM | POA: Diagnosis not present

## 2020-02-21 DIAGNOSIS — H2512 Age-related nuclear cataract, left eye: Secondary | ICD-10-CM | POA: Diagnosis not present

## 2020-02-21 DIAGNOSIS — Z961 Presence of intraocular lens: Secondary | ICD-10-CM | POA: Diagnosis not present

## 2020-03-06 DIAGNOSIS — H2512 Age-related nuclear cataract, left eye: Secondary | ICD-10-CM | POA: Diagnosis not present

## 2020-03-06 DIAGNOSIS — H2511 Age-related nuclear cataract, right eye: Secondary | ICD-10-CM | POA: Diagnosis not present

## 2020-03-06 DIAGNOSIS — Z961 Presence of intraocular lens: Secondary | ICD-10-CM | POA: Diagnosis not present

## 2020-03-10 DIAGNOSIS — E291 Testicular hypofunction: Secondary | ICD-10-CM | POA: Diagnosis not present

## 2020-04-03 DIAGNOSIS — E291 Testicular hypofunction: Secondary | ICD-10-CM | POA: Diagnosis not present

## 2020-04-12 DIAGNOSIS — Z20822 Contact with and (suspected) exposure to covid-19: Secondary | ICD-10-CM | POA: Diagnosis not present

## 2020-04-24 DIAGNOSIS — E291 Testicular hypofunction: Secondary | ICD-10-CM | POA: Diagnosis not present

## 2020-05-15 DIAGNOSIS — E291 Testicular hypofunction: Secondary | ICD-10-CM | POA: Diagnosis not present

## 2020-06-09 DIAGNOSIS — E291 Testicular hypofunction: Secondary | ICD-10-CM | POA: Diagnosis not present

## 2020-06-17 DIAGNOSIS — R7309 Other abnormal glucose: Secondary | ICD-10-CM | POA: Diagnosis not present

## 2020-06-17 DIAGNOSIS — I10 Essential (primary) hypertension: Secondary | ICD-10-CM | POA: Diagnosis not present

## 2020-06-17 DIAGNOSIS — E291 Testicular hypofunction: Secondary | ICD-10-CM | POA: Diagnosis not present

## 2020-06-17 DIAGNOSIS — E7800 Pure hypercholesterolemia, unspecified: Secondary | ICD-10-CM | POA: Diagnosis not present

## 2020-06-24 DIAGNOSIS — D649 Anemia, unspecified: Secondary | ICD-10-CM | POA: Diagnosis not present

## 2020-06-24 DIAGNOSIS — I10 Essential (primary) hypertension: Secondary | ICD-10-CM | POA: Diagnosis not present

## 2020-06-24 DIAGNOSIS — K219 Gastro-esophageal reflux disease without esophagitis: Secondary | ICD-10-CM | POA: Diagnosis not present

## 2020-06-24 DIAGNOSIS — E7800 Pure hypercholesterolemia, unspecified: Secondary | ICD-10-CM | POA: Diagnosis not present

## 2020-06-30 DIAGNOSIS — E291 Testicular hypofunction: Secondary | ICD-10-CM | POA: Diagnosis not present

## 2020-07-21 DIAGNOSIS — E291 Testicular hypofunction: Secondary | ICD-10-CM | POA: Diagnosis not present

## 2020-08-20 DIAGNOSIS — Z79899 Other long term (current) drug therapy: Secondary | ICD-10-CM | POA: Diagnosis not present

## 2020-08-20 DIAGNOSIS — Z5181 Encounter for therapeutic drug level monitoring: Secondary | ICD-10-CM | POA: Diagnosis not present

## 2020-08-20 DIAGNOSIS — L438 Other lichen planus: Secondary | ICD-10-CM | POA: Diagnosis not present

## 2020-09-01 DIAGNOSIS — E291 Testicular hypofunction: Secondary | ICD-10-CM | POA: Diagnosis not present

## 2020-10-09 DIAGNOSIS — E291 Testicular hypofunction: Secondary | ICD-10-CM | POA: Diagnosis not present

## 2020-10-30 DIAGNOSIS — E291 Testicular hypofunction: Secondary | ICD-10-CM | POA: Diagnosis not present

## 2020-11-20 DIAGNOSIS — E291 Testicular hypofunction: Secondary | ICD-10-CM | POA: Diagnosis not present

## 2020-12-11 DIAGNOSIS — E291 Testicular hypofunction: Secondary | ICD-10-CM | POA: Diagnosis not present

## 2020-12-25 DIAGNOSIS — R7309 Other abnormal glucose: Secondary | ICD-10-CM | POA: Diagnosis not present

## 2020-12-25 DIAGNOSIS — I10 Essential (primary) hypertension: Secondary | ICD-10-CM | POA: Diagnosis not present

## 2020-12-25 DIAGNOSIS — Z125 Encounter for screening for malignant neoplasm of prostate: Secondary | ICD-10-CM | POA: Diagnosis not present

## 2020-12-25 DIAGNOSIS — D649 Anemia, unspecified: Secondary | ICD-10-CM | POA: Diagnosis not present

## 2020-12-25 DIAGNOSIS — E291 Testicular hypofunction: Secondary | ICD-10-CM | POA: Diagnosis not present

## 2021-01-01 DIAGNOSIS — E7800 Pure hypercholesterolemia, unspecified: Secondary | ICD-10-CM | POA: Diagnosis not present

## 2021-01-01 DIAGNOSIS — I10 Essential (primary) hypertension: Secondary | ICD-10-CM | POA: Diagnosis not present

## 2021-01-01 DIAGNOSIS — E291 Testicular hypofunction: Secondary | ICD-10-CM | POA: Diagnosis not present

## 2021-01-01 DIAGNOSIS — Z0000 Encounter for general adult medical examination without abnormal findings: Secondary | ICD-10-CM | POA: Diagnosis not present

## 2021-01-22 ENCOUNTER — Emergency Department
Admission: EM | Admit: 2021-01-22 | Discharge: 2021-01-22 | Discharge status: Against Medical Advice | Payer: Medicare Other | Source: Home / Self Care

## 2021-01-22 ENCOUNTER — Other Ambulatory Visit: Payer: Self-pay

## 2021-01-26 DIAGNOSIS — E291 Testicular hypofunction: Secondary | ICD-10-CM | POA: Diagnosis not present

## 2021-01-27 DIAGNOSIS — S61219A Laceration without foreign body of unspecified finger without damage to nail, initial encounter: Secondary | ICD-10-CM | POA: Diagnosis not present

## 2021-02-23 DIAGNOSIS — Z5181 Encounter for therapeutic drug level monitoring: Secondary | ICD-10-CM | POA: Diagnosis not present

## 2021-02-23 DIAGNOSIS — L439 Lichen planus, unspecified: Secondary | ICD-10-CM | POA: Diagnosis not present

## 2021-02-23 DIAGNOSIS — Z79899 Other long term (current) drug therapy: Secondary | ICD-10-CM | POA: Diagnosis not present

## 2021-02-23 DIAGNOSIS — C44319 Basal cell carcinoma of skin of other parts of face: Secondary | ICD-10-CM | POA: Diagnosis not present

## 2021-02-23 DIAGNOSIS — L438 Other lichen planus: Secondary | ICD-10-CM | POA: Diagnosis not present

## 2021-03-03 DIAGNOSIS — E291 Testicular hypofunction: Secondary | ICD-10-CM | POA: Diagnosis not present

## 2021-03-31 DIAGNOSIS — C44319 Basal cell carcinoma of skin of other parts of face: Secondary | ICD-10-CM | POA: Diagnosis not present

## 2021-04-23 DIAGNOSIS — H52223 Regular astigmatism, bilateral: Secondary | ICD-10-CM | POA: Diagnosis not present

## 2021-04-23 DIAGNOSIS — Z9842 Cataract extraction status, left eye: Secondary | ICD-10-CM | POA: Diagnosis not present

## 2021-04-23 DIAGNOSIS — Z9841 Cataract extraction status, right eye: Secondary | ICD-10-CM | POA: Diagnosis not present

## 2021-05-13 DIAGNOSIS — E291 Testicular hypofunction: Secondary | ICD-10-CM | POA: Diagnosis not present

## 2021-06-03 DIAGNOSIS — E291 Testicular hypofunction: Secondary | ICD-10-CM | POA: Diagnosis not present

## 2021-07-08 DIAGNOSIS — E7800 Pure hypercholesterolemia, unspecified: Secondary | ICD-10-CM | POA: Diagnosis not present

## 2021-07-08 DIAGNOSIS — E291 Testicular hypofunction: Secondary | ICD-10-CM | POA: Diagnosis not present

## 2021-07-08 DIAGNOSIS — R7303 Prediabetes: Secondary | ICD-10-CM | POA: Diagnosis not present

## 2021-07-08 DIAGNOSIS — I10 Essential (primary) hypertension: Secondary | ICD-10-CM | POA: Diagnosis not present

## 2021-07-15 DIAGNOSIS — E7800 Pure hypercholesterolemia, unspecified: Secondary | ICD-10-CM | POA: Diagnosis not present

## 2021-07-15 DIAGNOSIS — E291 Testicular hypofunction: Secondary | ICD-10-CM | POA: Diagnosis not present

## 2021-07-15 DIAGNOSIS — R7303 Prediabetes: Secondary | ICD-10-CM | POA: Diagnosis not present

## 2021-07-15 DIAGNOSIS — I10 Essential (primary) hypertension: Secondary | ICD-10-CM | POA: Diagnosis not present

## 2021-07-29 DIAGNOSIS — E291 Testicular hypofunction: Secondary | ICD-10-CM | POA: Diagnosis not present

## 2021-07-30 DIAGNOSIS — H90A22 Sensorineural hearing loss, unilateral, left ear, with restricted hearing on the contralateral side: Secondary | ICD-10-CM | POA: Diagnosis not present

## 2021-07-30 DIAGNOSIS — H90A31 Mixed conductive and sensorineural hearing loss, unilateral, right ear with restricted hearing on the contralateral side: Secondary | ICD-10-CM | POA: Diagnosis not present

## 2021-08-19 DIAGNOSIS — E291 Testicular hypofunction: Secondary | ICD-10-CM | POA: Diagnosis not present

## 2021-09-09 DIAGNOSIS — M25562 Pain in left knee: Secondary | ICD-10-CM | POA: Diagnosis not present

## 2021-09-09 DIAGNOSIS — E291 Testicular hypofunction: Secondary | ICD-10-CM | POA: Diagnosis not present

## 2021-09-30 DIAGNOSIS — E291 Testicular hypofunction: Secondary | ICD-10-CM | POA: Diagnosis not present

## 2021-10-21 DIAGNOSIS — R7989 Other specified abnormal findings of blood chemistry: Secondary | ICD-10-CM | POA: Diagnosis not present

## 2021-11-11 DIAGNOSIS — E291 Testicular hypofunction: Secondary | ICD-10-CM | POA: Diagnosis not present

## 2021-11-23 DIAGNOSIS — K219 Gastro-esophageal reflux disease without esophagitis: Secondary | ICD-10-CM | POA: Diagnosis not present

## 2021-11-23 DIAGNOSIS — K449 Diaphragmatic hernia without obstruction or gangrene: Secondary | ICD-10-CM | POA: Diagnosis not present

## 2021-11-23 DIAGNOSIS — K2270 Barrett's esophagus without dysplasia: Secondary | ICD-10-CM | POA: Diagnosis not present

## 2021-12-03 DIAGNOSIS — E291 Testicular hypofunction: Secondary | ICD-10-CM | POA: Diagnosis not present

## 2021-12-24 DIAGNOSIS — E291 Testicular hypofunction: Secondary | ICD-10-CM | POA: Diagnosis not present

## 2021-12-31 DIAGNOSIS — K449 Diaphragmatic hernia without obstruction or gangrene: Secondary | ICD-10-CM | POA: Diagnosis not present

## 2021-12-31 DIAGNOSIS — K2270 Barrett's esophagus without dysplasia: Secondary | ICD-10-CM | POA: Diagnosis not present

## 2022-01-14 DIAGNOSIS — R7309 Other abnormal glucose: Secondary | ICD-10-CM | POA: Diagnosis not present

## 2022-01-14 DIAGNOSIS — E291 Testicular hypofunction: Secondary | ICD-10-CM | POA: Diagnosis not present

## 2022-01-14 DIAGNOSIS — E7800 Pure hypercholesterolemia, unspecified: Secondary | ICD-10-CM | POA: Diagnosis not present

## 2022-01-14 DIAGNOSIS — I10 Essential (primary) hypertension: Secondary | ICD-10-CM | POA: Diagnosis not present

## 2022-01-19 DIAGNOSIS — Z0000 Encounter for general adult medical examination without abnormal findings: Secondary | ICD-10-CM | POA: Diagnosis not present

## 2022-01-19 DIAGNOSIS — I10 Essential (primary) hypertension: Secondary | ICD-10-CM | POA: Diagnosis not present

## 2022-01-19 DIAGNOSIS — R7303 Prediabetes: Secondary | ICD-10-CM | POA: Diagnosis not present

## 2022-01-19 DIAGNOSIS — Z862 Personal history of diseases of the blood and blood-forming organs and certain disorders involving the immune mechanism: Secondary | ICD-10-CM | POA: Diagnosis not present

## 2022-02-12 DIAGNOSIS — Z23 Encounter for immunization: Secondary | ICD-10-CM | POA: Diagnosis not present

## 2022-02-12 DIAGNOSIS — E291 Testicular hypofunction: Secondary | ICD-10-CM | POA: Diagnosis not present

## 2022-03-25 DIAGNOSIS — E291 Testicular hypofunction: Secondary | ICD-10-CM | POA: Diagnosis not present

## 2022-05-11 DIAGNOSIS — E291 Testicular hypofunction: Secondary | ICD-10-CM | POA: Diagnosis not present

## 2022-06-02 DIAGNOSIS — E291 Testicular hypofunction: Secondary | ICD-10-CM | POA: Diagnosis not present

## 2022-06-23 DIAGNOSIS — E291 Testicular hypofunction: Secondary | ICD-10-CM | POA: Diagnosis not present

## 2022-06-29 DIAGNOSIS — H52223 Regular astigmatism, bilateral: Secondary | ICD-10-CM | POA: Diagnosis not present

## 2022-06-29 DIAGNOSIS — Z9841 Cataract extraction status, right eye: Secondary | ICD-10-CM | POA: Diagnosis not present

## 2022-06-29 DIAGNOSIS — Z9842 Cataract extraction status, left eye: Secondary | ICD-10-CM | POA: Diagnosis not present

## 2022-07-14 DIAGNOSIS — E291 Testicular hypofunction: Secondary | ICD-10-CM | POA: Diagnosis not present

## 2022-07-21 DIAGNOSIS — E291 Testicular hypofunction: Secondary | ICD-10-CM | POA: Diagnosis not present

## 2022-07-21 DIAGNOSIS — E7800 Pure hypercholesterolemia, unspecified: Secondary | ICD-10-CM | POA: Diagnosis not present

## 2022-07-21 DIAGNOSIS — I10 Essential (primary) hypertension: Secondary | ICD-10-CM | POA: Diagnosis not present

## 2022-07-21 DIAGNOSIS — N529 Male erectile dysfunction, unspecified: Secondary | ICD-10-CM | POA: Diagnosis not present

## 2022-08-04 DIAGNOSIS — E291 Testicular hypofunction: Secondary | ICD-10-CM | POA: Diagnosis not present

## 2022-08-25 DIAGNOSIS — E291 Testicular hypofunction: Secondary | ICD-10-CM | POA: Diagnosis not present

## 2022-08-26 DIAGNOSIS — K080 Exfoliation of teeth due to systemic causes: Secondary | ICD-10-CM | POA: Diagnosis not present

## 2022-10-05 DIAGNOSIS — E291 Testicular hypofunction: Secondary | ICD-10-CM | POA: Diagnosis not present

## 2022-10-21 DIAGNOSIS — K080 Exfoliation of teeth due to systemic causes: Secondary | ICD-10-CM | POA: Diagnosis not present

## 2022-11-03 DIAGNOSIS — E291 Testicular hypofunction: Secondary | ICD-10-CM | POA: Diagnosis not present

## 2022-11-24 DIAGNOSIS — E291 Testicular hypofunction: Secondary | ICD-10-CM | POA: Diagnosis not present

## 2022-12-30 DIAGNOSIS — E291 Testicular hypofunction: Secondary | ICD-10-CM | POA: Diagnosis not present

## 2023-01-19 DIAGNOSIS — I10 Essential (primary) hypertension: Secondary | ICD-10-CM | POA: Diagnosis not present

## 2023-01-19 DIAGNOSIS — R7303 Prediabetes: Secondary | ICD-10-CM | POA: Diagnosis not present

## 2023-01-19 DIAGNOSIS — E7800 Pure hypercholesterolemia, unspecified: Secondary | ICD-10-CM | POA: Diagnosis not present

## 2023-01-19 DIAGNOSIS — E291 Testicular hypofunction: Secondary | ICD-10-CM | POA: Diagnosis not present

## 2023-01-26 DIAGNOSIS — Z23 Encounter for immunization: Secondary | ICD-10-CM | POA: Diagnosis not present

## 2023-01-26 DIAGNOSIS — Z0000 Encounter for general adult medical examination without abnormal findings: Secondary | ICD-10-CM | POA: Diagnosis not present

## 2023-05-04 DIAGNOSIS — K080 Exfoliation of teeth due to systemic causes: Secondary | ICD-10-CM | POA: Diagnosis not present

## 2023-07-12 DIAGNOSIS — Z9841 Cataract extraction status, right eye: Secondary | ICD-10-CM | POA: Diagnosis not present

## 2023-07-12 DIAGNOSIS — H52223 Regular astigmatism, bilateral: Secondary | ICD-10-CM | POA: Diagnosis not present

## 2023-07-12 DIAGNOSIS — Z9842 Cataract extraction status, left eye: Secondary | ICD-10-CM | POA: Diagnosis not present

## 2023-07-27 DIAGNOSIS — E291 Testicular hypofunction: Secondary | ICD-10-CM | POA: Diagnosis not present

## 2023-07-27 DIAGNOSIS — R7303 Prediabetes: Secondary | ICD-10-CM | POA: Diagnosis not present

## 2023-07-27 DIAGNOSIS — E7800 Pure hypercholesterolemia, unspecified: Secondary | ICD-10-CM | POA: Diagnosis not present

## 2023-07-27 DIAGNOSIS — I10 Essential (primary) hypertension: Secondary | ICD-10-CM | POA: Diagnosis not present

## 2023-08-04 DIAGNOSIS — M1990 Unspecified osteoarthritis, unspecified site: Secondary | ICD-10-CM | POA: Diagnosis not present

## 2023-08-04 DIAGNOSIS — K219 Gastro-esophageal reflux disease without esophagitis: Secondary | ICD-10-CM | POA: Diagnosis not present

## 2023-08-04 DIAGNOSIS — E291 Testicular hypofunction: Secondary | ICD-10-CM | POA: Diagnosis not present

## 2023-11-11 DIAGNOSIS — K080 Exfoliation of teeth due to systemic causes: Secondary | ICD-10-CM | POA: Diagnosis not present

## 2023-11-29 DIAGNOSIS — E7800 Pure hypercholesterolemia, unspecified: Secondary | ICD-10-CM | POA: Diagnosis not present

## 2023-11-29 DIAGNOSIS — I10 Essential (primary) hypertension: Secondary | ICD-10-CM | POA: Diagnosis not present

## 2023-11-29 DIAGNOSIS — K219 Gastro-esophageal reflux disease without esophagitis: Secondary | ICD-10-CM | POA: Diagnosis not present

## 2023-11-29 DIAGNOSIS — R7303 Prediabetes: Secondary | ICD-10-CM | POA: Diagnosis not present

## 2023-11-29 DIAGNOSIS — Z125 Encounter for screening for malignant neoplasm of prostate: Secondary | ICD-10-CM | POA: Diagnosis not present

## 2023-12-06 DIAGNOSIS — E1169 Type 2 diabetes mellitus with other specified complication: Secondary | ICD-10-CM | POA: Diagnosis not present

## 2023-12-06 DIAGNOSIS — I10 Essential (primary) hypertension: Secondary | ICD-10-CM | POA: Diagnosis not present

## 2023-12-06 DIAGNOSIS — E781 Pure hyperglyceridemia: Secondary | ICD-10-CM | POA: Diagnosis not present

## 2023-12-06 DIAGNOSIS — E785 Hyperlipidemia, unspecified: Secondary | ICD-10-CM | POA: Diagnosis not present

## 2024-02-29 DIAGNOSIS — E1169 Type 2 diabetes mellitus with other specified complication: Secondary | ICD-10-CM | POA: Diagnosis not present

## 2024-02-29 DIAGNOSIS — I10 Essential (primary) hypertension: Secondary | ICD-10-CM | POA: Diagnosis not present

## 2024-02-29 DIAGNOSIS — E781 Pure hyperglyceridemia: Secondary | ICD-10-CM | POA: Diagnosis not present

## 2024-02-29 DIAGNOSIS — E785 Hyperlipidemia, unspecified: Secondary | ICD-10-CM | POA: Diagnosis not present

## 2024-03-08 DIAGNOSIS — I10 Essential (primary) hypertension: Secondary | ICD-10-CM | POA: Diagnosis not present

## 2024-03-08 DIAGNOSIS — E291 Testicular hypofunction: Secondary | ICD-10-CM | POA: Diagnosis not present

## 2024-03-08 DIAGNOSIS — E1169 Type 2 diabetes mellitus with other specified complication: Secondary | ICD-10-CM | POA: Diagnosis not present

## 2024-03-08 DIAGNOSIS — Z0000 Encounter for general adult medical examination without abnormal findings: Secondary | ICD-10-CM | POA: Diagnosis not present

## 2024-03-08 DIAGNOSIS — E785 Hyperlipidemia, unspecified: Secondary | ICD-10-CM | POA: Diagnosis not present

## 2024-04-16 DIAGNOSIS — K080 Exfoliation of teeth due to systemic causes: Secondary | ICD-10-CM | POA: Diagnosis not present
